# Patient Record
Sex: Male | Born: 1995 | Race: Black or African American | Hispanic: No | Marital: Single | Smoking: Never smoker
Health system: Southern US, Community
[De-identification: ages and names within clinical notes are randomized; demographics above are authoritative.]

## PROBLEM LIST (undated history)

## (undated) HISTORY — PX: SHOULDER SURGERY: SHX246

---

## 2019-06-18 ENCOUNTER — Emergency Department (HOSPITAL_COMMUNITY)
Admission: EM | Admit: 2019-06-18 | Discharge: 2019-06-18 | Disposition: A | Discharge status: Home / Self Care | Attending: Emergency Medicine | Admitting: Emergency Medicine

## 2019-06-18 ENCOUNTER — Encounter (HOSPITAL_COMMUNITY): Payer: Self-pay | Admitting: Emergency Medicine

## 2019-06-18 DIAGNOSIS — R519 Headache, unspecified: Secondary | ICD-10-CM

## 2019-06-18 DIAGNOSIS — R51 Headache: Secondary | ICD-10-CM | POA: Insufficient documentation

## 2019-06-18 MED ORDER — KETOROLAC TROMETHAMINE 10 MG PO TABS
20.0000 mg | ORAL_TABLET | Freq: Once | ORAL | Status: DC
  Filled 2019-06-18: qty 2

## 2019-06-18 MED ORDER — PROCHLORPERAZINE MALEATE 5 MG PO TABS
10.0000 mg | ORAL_TABLET | Freq: Once | ORAL | Status: AC
  Administered 2019-06-18: 10 mg via ORAL
  Filled 2019-06-18: qty 2

## 2019-06-18 MED ORDER — DIPHENHYDRAMINE HCL 25 MG PO CAPS
25.0000 mg | ORAL_CAPSULE | Freq: Once | ORAL | Status: AC
  Administered 2019-06-18: 25 mg via ORAL
  Filled 2019-06-18: qty 1

## 2019-06-18 MED ORDER — KETOROLAC TROMETHAMINE 30 MG/ML IJ SOLN
INTRAMUSCULAR | Status: AC
  Administered 2019-06-18: 30 mg via INTRAMUSCULAR
  Filled 2019-06-18: qty 1

## 2019-06-18 MED ORDER — KETOROLAC TROMETHAMINE 30 MG/ML IJ SOLN
30.0000 mg | Freq: Once | INTRAMUSCULAR | Status: AC
  Administered 2019-06-18: 23:00:00 30 mg via INTRAMUSCULAR

## 2019-06-18 NOTE — ED Notes (Signed)
Patient verbalizes understanding of discharge instructions. Opportunity for questioning and answers were provided. Armband removed by staff, pt discharged from ED ambulatory by self\  

## 2019-06-18 NOTE — ED Triage Notes (Signed)
Pt c/o generalized HA x 2 days, denies n/v, denies photo/phono sensitivity. No relief with Ibuprofen.

## 2019-06-18 NOTE — Discharge Instructions (Signed)
You may take 1000 mg Tylenol at a time for your headache as well as 800 mg of ibuprofen.  Do not take more than 4000 mg Tylenol daily or more than 2400 mg of ibuprofen daily.  If you develop worsening headache, sudden onset thunderclap headache, inability to move her neck, blurred vision, unilateral weakness please seek reevaluation.

## 2019-06-18 NOTE — ED Provider Notes (Signed)
[facility] Provider Note   CSN: [id number] Arrival date & time: 06/18/19  2155    History   Chief Complaint Chief Complaint  Patient presents with  . Headache    HPI Latina CraverJadan D Falco is a 23 y.o. male with past medical history who presents for evaluation of headache.  Patient states he has had a generalized headache x2 days.  Pain located to his entire head.  Pain started approximately 2 days ago when he woke up in the morning.  Denies sudden onset thunderclap headache.  Has worsened today.  Took 2 ibuprofens at 4 AM this morning and then again at 12 PM however is not take anything since.  Denies photophobia or phonophobia.  Does have history of headaches however states they normally go away overnight.  Has had some generalized body aches and pains.  Denies fever, chills, nausea, vomiting, chest pain, shortness of breath, neck stiffness, neck rigidity, chest pain, shortness of breath, abdominal pain, diarrhea, dysuria, facial droop, unilateral weakness, dysphasia, difficulty with word finding, dizziness, lightheadedness.  Denies additional aggravating or alleviating factors.  Rates his pain a 5/10.  Denies radiation.  Described as an aching.  History obtained from patient and past medical records.  No interpreter was used.     HPI  History reviewed. No pertinent past medical history.  There are no active problems to display for this patient.   Past Surgical History:  Procedure Laterality Date  . SHOULDER SURGERY          Home Medications    Prior to Admission medications   Not on File    Family History No family history on file.  Social History Social History   Tobacco Use  . Smoking status: Never Smoker  . Smokeless tobacco: Never Used  Substance Use Topics  . Alcohol use: Never    Frequency: Never  . Drug use: Never     Allergies   Patient has no known allergies.   Review of Systems Review of Systems   Constitutional: Negative.   HENT: Negative.   Respiratory: Negative.   Cardiovascular: Negative.   Genitourinary: Negative.   Musculoskeletal: Negative.   Skin: Negative.   Neurological: Positive for headaches. Negative for dizziness, tremors, seizures, syncope, facial asymmetry, speech difficulty, weakness, light-headedness and numbness.  All other systems reviewed and are negative.  Physical Exam Updated Vital Signs BP 120/75   Pulse 99   Temp 98.4 F (36.9 C) (Oral)   Resp 16   SpO2 97%   Physical Exam  Physical Exam  Constitutional: Pt is oriented to person, place, and time. Pt appears well-developed and well-nourished. No distress.  HENT:  Head: Normocephalic and atraumatic.  Mouth/Throat: Oropharynx is clear and moist.  Eyes: Conjunctivae and EOM are normal. Pupils are equal, round, and reactive to light. No scleral icterus.  No horizontal, vertical or rotational nystagmus  Neck: Normal range of motion. Neck supple.  Full active and passive ROM without pain No midline or paraspinal tenderness No nuchal rigidity or meningeal signs  Cardiovascular: Normal rate, regular rhythm and intact distal pulses.   Pulmonary/Chest: Effort normal and breath sounds normal. No respiratory distress. Pt has no wheezes. No rales.  Abdominal: Soft. Bowel sounds are normal. There is no tenderness. There is no rebound and no guarding.  Musculoskeletal: Normal range of motion.  Lymphadenopathy:   No cervical adenopathy.  Neurological: Pt. is alert and oriented to person, place, and time. He has normal reflexes. No cranial nerve  deficit.  Exhibits normal muscle tone. Coordination normal.  Mental Status:  Alert, oriented, thought content appropriate. Speech fluent without evidence of aphasia. Able to follow 2 step commands without difficulty.  Cranial Nerves:  II:  Peripheral visual fields grossly normal, pupils equal, round, reactive to light III,IV, VI: ptosis not present, extra-ocular  motions intact bilaterally  V,VII: smile symmetric, facial light touch sensation equal VIII: hearing grossly normal bilaterally  IX,X: midline uvula rise  XI: bilateral shoulder shrug equal and strong XII: midline tongue extension  Motor:  5/5 in upper and lower extremities bilaterally including strong and equal grip strength and dorsiflexion/plantar flexion Sensory: Pinprick and light touch normal in all extremities.  Deep Tendon Reflexes: 2+ and symmetric  Cerebellar: normal finger-to-nose with bilateral upper extremities Gait: normal gait and balance CV: distal pulses palpable throughout   Skin: Skin is warm and dry. No rash noted. Pt is not diaphoretic.  Psychiatric: Pt has a normal mood and affect. Behavior is normal. Judgment and thought content normal.  Nursing note and vitals reviewed. ED Treatments / Results  Labs (all labs ordered are listed, but only abnormal results are displayed) Labs Reviewed - No data to display  EKG None  Radiology No results found.  Procedures Procedures (including critical care time)  Medications Ordered in ED Medications  prochlorperazine (COMPAZINE) tablet 10 mg (10 mg Oral Given 06/18/19 2248)  diphenhydrAMINE (BENADRYL) capsule 25 mg (25 mg Oral Given 06/18/19 2248)  ketorolac (TORADOL) 30 MG/ML injection 30 mg (30 mg Intramuscular Given 06/18/19 2248)    Initial Impression / Assessment and Plan / ED Course  I have reviewed the triage vital signs and the nursing notes.  Pertinent labs & imaging results that were available during my care of the patient were reviewed by me and considered in my medical decision making (see chart for details).  23 year old male appears otherwise well presents for evaluation of generalized headache x2 days.  Denies sudden onset thunderclap headache.  He is afebrile, nonseptic, non-ill-appearing.  He has a nonfocal neurologic exam without neurologic deficits.  Has had some generalized body aches and pains  however denies any upper respiratory symptoms or fever.  He is tolerating p.o. intake at home.  No known food exposures.  Has history of headaches for these normally go away when he wakes up.  Has taken ibuprofen twice today.  Has not tried Tylenol.  She does not want IV medications at this time.  Will give p.o. medications.  Nursing has informed me Toradol PO will need to be sent from pharmacy.  Discussed with patient.  He elects IM Toradol and will give p.o. Benadryl and Compazine for headache because " I dont want to wait."  Has had some mild body aches and pains however denies any upper respiratory infection and COVID exposures.  I have low suspicion for COVID infection.  2300: Patient playing video game on phone with all lights no. No acute distress noted. Just given IM Toradol. Will reassess.  2320: Headache completely resolved on reevaluation.  Continues to be neurologically intact.  Discussed return precautions.  Patient voices understanding and is agreeable for follow-up.  Pt HA treated and improved while in ED.  Presentation is like pts typical HA and non concerning for [facility], ICH, Meningitis, or temporal arteritis. Pt is afebrile with no focal neuro deficits, nuchal rigidity, or change in vision. Pt is to follow up with PCP to discuss prophylactic medication. Pt verbalizes understanding and is agreeable with plan to dc.  Final Clinical Impressions(s) / ED Diagnoses   Final diagnoses:  Acute nonintractable headache, unspecified headache type    ED Discharge Orders    None       Kamariyah Timberlake A, PA-C 06/18/19 2324    Maia Plan, MD 06/19/19 [phone]

## 2019-06-24 ENCOUNTER — Emergency Department (HOSPITAL_COMMUNITY)
Admission: EM | Admit: 2019-06-24 | Discharge: 2019-06-24 | Disposition: A | Discharge status: Home / Self Care | Attending: Emergency Medicine | Admitting: Emergency Medicine

## 2019-06-24 ENCOUNTER — Other Ambulatory Visit: Payer: Self-pay

## 2019-06-24 ENCOUNTER — Emergency Department (HOSPITAL_COMMUNITY)

## 2019-06-24 ENCOUNTER — Encounter (HOSPITAL_COMMUNITY): Payer: Self-pay

## 2019-06-24 DIAGNOSIS — N390 Urinary tract infection, site not specified: Secondary | ICD-10-CM | POA: Diagnosis not present

## 2019-06-24 DIAGNOSIS — R319 Hematuria, unspecified: Secondary | ICD-10-CM | POA: Diagnosis present

## 2019-06-24 LAB — URINALYSIS, ROUTINE W REFLEX MICROSCOPIC
Bilirubin Urine: NEGATIVE
Glucose, UA: NEGATIVE mg/dL
Ketones, ur: NEGATIVE mg/dL
Nitrite: NEGATIVE
Protein, ur: 100 mg/dL — AB
RBC / HPF: >50 RBC/hpf — ABNORMAL HIGH (ref 0–5)
Specific Gravity, Urine: 1.024 (ref 1.005–1.030)
WBC, UA: >50 WBC/hpf — ABNORMAL HIGH (ref 0–5)
pH: 5 (ref 5.0–8.0)

## 2019-06-24 LAB — CBC WITH DIFFERENTIAL/PLATELET
Abs Immature Granulocytes: 0.04 10*3/uL (ref 0.00–0.07)
Basophils Absolute: 0.1 10*3/uL (ref 0.0–0.1)
Basophils Relative: 1 %
Eosinophils Absolute: 0 10*3/uL (ref 0.0–0.5)
Eosinophils Relative: 0 %
HCT: 43.2 % (ref 39.0–52.0)
Hemoglobin: 14.4 g/dL (ref 13.0–17.0)
Immature Granulocytes: 0 %
Lymphocytes Relative: 22 %
Lymphs Abs: 2.3 10*3/uL (ref 0.7–4.0)
MCH: 32.1 pg (ref 26.0–34.0)
MCHC: 33.3 g/dL (ref 30.0–36.0)
MCV: 96.4 fL (ref 80.0–100.0)
Monocytes Absolute: 0.8 10*3/uL (ref 0.1–1.0)
Monocytes Relative: 8 %
Neutro Abs: 6.9 10*3/uL (ref 1.7–7.7)
Neutrophils Relative %: 69 %
Platelets: 303 10*3/uL (ref 150–400)
RBC: 4.48 MIL/uL (ref 4.22–5.81)
RDW: 11.5 % (ref 11.5–15.5)
WBC: 10.1 10*3/uL (ref 4.0–10.5)
nRBC: 0 % (ref 0.0–0.2)

## 2019-06-24 LAB — BASIC METABOLIC PANEL
Anion gap: 12 (ref 5–15)
BUN: 10 mg/dL (ref 6–20)
CO2: 23 mmol/L (ref 22–32)
Calcium: 9.2 mg/dL (ref 8.9–10.3)
Chloride: 102 mmol/L (ref 98–111)
Creatinine, Ser: 1.31 mg/dL — ABNORMAL HIGH (ref 0.61–1.24)
GFR calc Af Amer: >60 mL/min (ref >60)
GFR calc non Af Amer: >60 mL/min (ref >60)
Glucose, Bld: 92 mg/dL (ref 70–99)
Potassium: 3.4 mmol/L — ABNORMAL LOW (ref 3.5–5.1)
Sodium: 137 mmol/L (ref 135–145)

## 2019-06-24 MED ORDER — CEPHALEXIN 500 MG PO CAPS: 500.0000 mg | ORAL_CAPSULE | Freq: Two times a day (BID) | ORAL | 0 refills | Status: AC

## 2019-06-24 MED ORDER — SODIUM CHLORIDE 0.9 % IV SOLN
1.0000 g | Freq: Once | INTRAVENOUS | Status: AC
  Administered 2019-06-24: 1 g via INTRAVENOUS
  Filled 2019-06-24: qty 10

## 2019-06-24 MED ORDER — SODIUM CHLORIDE 0.9 % IV BOLUS
500.0000 mL | Freq: Once | INTRAVENOUS | Status: AC
  Administered 2019-06-24: 500 mL via INTRAVENOUS

## 2019-06-24 NOTE — ED Provider Notes (Signed)
[facility] EMERGENCY DEPARTMENT Provider Note   CSN: [id number] Arrival date & time: 06/24/19  1741     History   Chief Complaint Chief Complaint  Patient presents with  . Hematuria    HPI Earl Carter is a 23 y.o. male who is previously healthy who presents with a 1 day history of hematuria.  Patient reports he has been urinating normally and then passed some blood.  After that, it was normal.  He had some trouble pushing the urine out.  About a day ago, he had some right flank pain, which has resolved.  He denies any recent sexual activity, over 3 months ago, and before that, he had negative STD screening.  Patient denies any penile discharge or scrotal pain or swelling.  He also denies any fevers, chest pain, shortness of breath, nausea, vomiting, diarrhea.  Patient denies any history of kidney stones.     HPI  History reviewed. No pertinent past medical history.  There are no active problems to display for this patient.   Past Surgical History:  Procedure Laterality Date  . SHOULDER SURGERY          Home Medications    Prior to Admission medications   Medication Sig Start Date End Date Taking? Authorizing Provider  cephALEXin (KEFLEX) 500 MG capsule Take 1 capsule (500 mg total) by mouth 2 (two) times daily. 06/24/19   Emi Holes, PA-C    Family History History reviewed. No pertinent family history.  Social History Social History   Tobacco Use  . Smoking status: Never Smoker  . Smokeless tobacco: Never Used  Substance Use Topics  . Alcohol use: Never    Frequency: Never  . Drug use: Never     Allergies   Patient has no known allergies.   Review of Systems Review of Systems  Constitutional: Negative for chills and fever.  HENT: Negative for facial swelling and sore throat.   Respiratory: Negative for shortness of breath.   Cardiovascular: Negative for chest pain.  Gastrointestinal: Negative for abdominal pain, nausea and  vomiting.  Genitourinary: Positive for flank pain (resolved) and hematuria. Negative for dysuria.  Musculoskeletal: Negative for back pain.  Skin: Negative for rash and wound.  Neurological: Negative for headaches.  Psychiatric/Behavioral: The patient is not nervous/anxious.      Physical Exam Updated Vital Signs BP 125/73 (BP Location: Right Arm)   Pulse 77   Temp 98.6 F (37 C) (Oral)   Resp 18   Ht 5\' 8"  (1.727 m)   Wt 76.2 kg   SpO2 100%   BMI 25.54 kg/m   Physical Exam Vitals signs and nursing note reviewed.  Constitutional:      General: He is not in acute distress.    Appearance: He is well-developed. He is not diaphoretic.  HENT:     Head: Normocephalic and atraumatic.     Mouth/Throat:     Pharynx: No oropharyngeal exudate.  Eyes:     General: No scleral icterus.       Right eye: No discharge.        Left eye: No discharge.     Conjunctiva/sclera: Conjunctivae normal.     Pupils: Pupils are equal, round, and reactive to light.  Neck:     Musculoskeletal: Normal range of motion and neck supple.     Thyroid: No thyromegaly.  Cardiovascular:     Rate and Rhythm: Normal rate and regular rhythm.     Heart sounds: Normal  heart sounds. No murmur. No friction rub. No gallop.   Pulmonary:     Effort: Pulmonary effort is normal. No respiratory distress.     Breath sounds: Normal breath sounds. No stridor. No wheezing or rales.  Abdominal:     General: Bowel sounds are normal. There is no distension.     Palpations: Abdomen is soft.     Tenderness: There is no abdominal tenderness. There is no guarding or rebound.  Lymphadenopathy:     Cervical: No cervical adenopathy.  Skin:    General: Skin is warm and dry.     Coloration: Skin is not pale.     Findings: No rash.  Neurological:     Mental Status: He is alert.     Coordination: Coordination normal.      ED Treatments / Results  Labs (all labs ordered are listed, but only abnormal results are displayed)  Labs Reviewed  URINALYSIS, ROUTINE W REFLEX MICROSCOPIC - Abnormal; Notable for the following components:      Result Value   APPearance CLOUDY (*)    Hgb urine dipstick LARGE (*)    Protein, ur 100 (*)    Leukocytes,Ua LARGE (*)    RBC / HPF >50 (*)    WBC, UA >50 (*)    Bacteria, UA RARE (*)    All other components within normal limits  BASIC METABOLIC PANEL - Abnormal; Notable for the following components:   Potassium 3.4 (*)    Creatinine, Ser 1.31 (*)    All other components within normal limits  URINE CULTURE  CBC WITH DIFFERENTIAL/PLATELET  GC/CHLAMYDIA PROBE AMP ([facility] NOT [facility]    EKG None  Radiology Ct Renal Stone Study  Result Date: 06/24/2019 CLINICAL DATA:  Right-sided flank pain with stone disease suspected. EXAM: CT ABDOMEN AND PELVIS WITHOUT CONTRAST TECHNIQUE: Multidetector CT imaging of the abdomen and pelvis was performed following the standard protocol without IV contrast. COMPARISON:  None. FINDINGS: Lower chest: The lung bases are clear. The heart size is normal. Hepatobiliary: The liver is normal. Normal gallbladder.There is no biliary ductal dilation. Pancreas: Normal contours without ductal dilatation. No peripancreatic fluid collection. Spleen: No splenic laceration or hematoma. Adrenals/Urinary Tract: --Adrenal glands: No adrenal hemorrhage. --Right kidney/ureter: No hydronephrosis or perinephric hematoma. --Left kidney/ureter: No hydronephrosis or perinephric hematoma. --Urinary bladder: There is some mild bladder wall thickening. The bladder is somewhat underdistended. Stomach/Bowel: --Stomach/Duodenum: No hiatal hernia or other gastric abnormality. Normal duodenal course and caliber. --Small bowel: No dilatation or inflammation. --Colon: No focal abnormality. --Appendix: Appendix is somewhat difficult to identified appears to be located in the right lower quadrant best visualized on the coronal series image 31. The appendix measures approximately 4 mm.  Vascular/Lymphatic: Normal course and caliber of the major abdominal vessels. --No retroperitoneal lymphadenopathy. --No mesenteric lymphadenopathy. --No pelvic or inguinal lymphadenopathy. Reproductive: Unremarkable multiple phleboliths are noted in the patient's pelvis. Other: No ascites or free air. The abdominal wall is normal. Musculoskeletal. There is an old fracture deformity of the left iliac bone. There is no acute displaced fracture or dislocation. IMPRESSION: 1. Examination limited by lack of IV contrast. 2. No acute abdominopelvic abnormality. 3. There is some mild bladder wall thickening. Correlate with urinalysis for possible cystitis. 4. No hydronephrosis.  No radiopaque kidney stones. Electronically Signed   By: Katherine Mantlehristopher  Green M.D.   On: 06/24/2019 20:40    Procedures Procedures (including critical care time)  Medications Ordered in ED Medications  sodium chloride 0.9 % bolus  500 mL (0 mLs Intravenous Stopped 06/24/19 2102)  cefTRIAXone (ROCEPHIN) 1 g in sodium chloride 0.9 % 100 mL IVPB (0 g Intravenous Stopped 06/24/19 2102)     Initial Impression / Assessment and Plan / ED Course  I have reviewed the triage vital signs and the nursing notes.  Pertinent labs & imaging results that were available during my care of the patient were reviewed by me and considered in my medical decision making (see chart for details).        Patient found to have UTI with mild elevation in creatinine, 1.31.  IV fluids and IV Rocephin given.  CT renal stone study is negative except for some mild bladder thickening consistent with cystitis.  GC/chlamydia sent and pending, however patient adamantly denies no recent sexual contact and negative STD checks before that.  Urine culture sent.  Will treat with Keflex.  Follow-up to urology if symptoms not improving.  Patient understands and agrees with plan.  Patient vital stable throughout ED course and discharged in satisfactory condition. I discussed  patient case with Dr. Francia Greaves who guided the patient's management and agrees with plan.   Final Clinical Impressions(s) / ED Diagnoses   Final diagnoses:  Lower urinary tract infectious disease    ED Discharge Orders         Ordered    cephALEXin (KEFLEX) 500 MG capsule  2 times daily     06/24/19 2221           Frederica Kuster, PA-C 06/25/19 1609    Valarie Merino, MD 06/30/19 2245

## 2019-06-24 NOTE — ED Triage Notes (Signed)
Pt state that he had a difficult time urinating at home & that after he finally did he had some blood come out of his penis. Pt stated that yesterday he had some Rt sided flank pain but has been afebrile so far.

## 2019-06-24 NOTE — Discharge Instructions (Addendum)
Take Keflex until completed.  You will be called if you test positive for gonorrhea or chlamydia or if you need to change your antibiotic after the urine culture.  Please follow-up with urologist, especially if you have continuing symptoms.  Please return to emergency department if you develop any new or worsening symptoms, including fever of 100.4, severe abdominal or flank pain, vomiting, or any other concerning symptoms.

## 2019-06-26 LAB — URINE CULTURE: Culture: NO GROWTH

## 2019-06-29 ENCOUNTER — Telehealth (HOSPITAL_COMMUNITY): Payer: Self-pay

## 2019-06-29 LAB — GC/CHLAMYDIA PROBE AMP (~~LOC~~) NOT AT ARMC
Chlamydia: NEGATIVE
Neisseria Gonorrhea: NEGATIVE

## 2020-07-16 ENCOUNTER — Encounter (HOSPITAL_COMMUNITY): Payer: Self-pay

## 2020-07-16 ENCOUNTER — Other Ambulatory Visit: Payer: Self-pay

## 2020-07-16 ENCOUNTER — Emergency Department (HOSPITAL_COMMUNITY)
Admission: EM | Admit: 2020-07-16 | Discharge: 2020-07-16 | Disposition: A | Discharge status: Home / Self Care | Attending: Emergency Medicine | Admitting: Emergency Medicine

## 2020-07-16 DIAGNOSIS — J029 Acute pharyngitis, unspecified: Secondary | ICD-10-CM | POA: Insufficient documentation

## 2020-07-16 LAB — GROUP A STREP BY PCR: Group A Strep by PCR: NOT DETECTED

## 2020-07-16 NOTE — ED Triage Notes (Signed)
Pt arrived via walk in, c/o sore throat x3 days. States there were some white patches he saw as well.

## 2020-07-16 NOTE — ED Notes (Signed)
Pt verbalized dc instructions and follow up care. Alert and ambulatory. No iv. 

## 2020-07-16 NOTE — ED Provider Notes (Signed)
[facility]-EMERGENCY DEPT Provider Note   CSN: [id number] Arrival date & time: 07/16/20  1551     History Chief Complaint  Patient presents with  . Sore Throat    Earl Carter is a 24 y.o. male    The history is provided by the patient.  Sore Throat This is a new problem. The current episode started 2 days ago. The problem occurs constantly. The problem has not changed since onset.Pertinent negatives include no chest pain, no abdominal pain, no headaches and no shortness of breath. The symptoms are aggravated by swallowing. The symptoms are relieved by acetaminophen. He has tried nothing for the symptoms. The treatment provided significant relief.       History reviewed. No pertinent past medical history.  There are no problems to display for this patient.   Past Surgical History:  Procedure Laterality Date  . SHOULDER SURGERY         History reviewed. No pertinent family history.  Social History   Tobacco Use  . Smoking status: Never Smoker  . Smokeless tobacco: Never Used  Substance Use Topics  . Alcohol use: Never  . Drug use: Never    Home Medications Prior to Admission medications   Medication Sig Start Date End Date Taking? Authorizing Provider  cephALEXin (KEFLEX) 500 MG capsule Take 1 capsule (500 mg total) by mouth 2 (two) times daily. 06/24/19   Emi Holes, PA-C    Allergies    Patient has no known allergies.  Review of Systems   Review of Systems  Constitutional: Negative for chills and fever.  HENT: Positive for sore throat. Negative for voice change.   Respiratory: Negative for cough and shortness of breath.   Cardiovascular: Negative for chest pain.  Gastrointestinal: Negative for abdominal pain.  Musculoskeletal: Negative for neck stiffness.  Skin: Negative for rash.  Neurological: Negative for weakness and headaches.    Physical Exam Updated Vital Signs BP 133/74 (BP Location: Left Arm)   Pulse 72    Temp 98.5 F (36.9 C) (Oral)   Resp 16   Ht 5' 7.5" (1.715 m)   Wt 77.1 kg   SpO2 99%   BMI 26.23 kg/m   Physical Exam Vitals and nursing note reviewed.  Constitutional:      General: He is not in acute distress.    Appearance: He is well-developed. He is not diaphoretic.  HENT:     Head: Normocephalic and atraumatic.     Nose: No congestion.     Mouth/Throat:     Mouth: Mucous membranes are moist.     Pharynx: Uvula midline. No pharyngeal swelling, oropharyngeal exudate, posterior oropharyngeal erythema or uvula swelling.     Tonsils: Tonsillar exudate present. No tonsillar abscesses. 1+ on the right. 1+ on the left.  Eyes:     General: No scleral icterus.    Conjunctiva/sclera: Conjunctivae normal.  Cardiovascular:     Rate and Rhythm: Normal rate and regular rhythm.     Heart sounds: Normal heart sounds.  Pulmonary:     Effort: Pulmonary effort is normal. No respiratory distress.     Breath sounds: Normal breath sounds.  Abdominal:     Palpations: Abdomen is soft.     Tenderness: There is no abdominal tenderness.  Musculoskeletal:     Cervical back: Normal range of motion and neck supple.  Lymphadenopathy:     Head:     Right side of head: Tonsillar adenopathy present.     Left  side of head: Tonsillar adenopathy present.  Skin:    General: Skin is warm and dry.  Neurological:     Mental Status: He is alert.  Psychiatric:        Behavior: Behavior normal.     ED Results / Procedures / Treatments   Labs (all labs ordered are listed, but only abnormal results are displayed) Labs Reviewed  GROUP A STREP BY PCR    EKG None  Radiology No results found.  Procedures Procedures (including critical care time)  Medications Ordered in ED Medications - No data to display  ED Course  I have reviewed the triage vital signs and the nursing notes.  Pertinent labs & imaging results that were available during my care of the patient were reviewed by me and  considered in my medical decision making (see chart for details).    MDM Rules/Calculators/A&P                          Pt afebrile without tonsillar exudate, negative strep. Presents with mild cervical lymphadenopathy, & dysphagia; diagnosis of viral pharyngitis. No abx indicated. DC w symptomatic tx for pain  Pt does not appear dehydrated, but did discuss importance of water rehydration. Presentation non concerning for PTA or infxn spread to soft tissue. No trismus or uvula deviation. Specific return precautions discussed. Pt able to drink water in ED without difficulty with intact air way. Recommended PCP follow up.  Final Clinical Impression(s) / ED Diagnoses Final diagnoses:  Viral pharyngitis    Rx / DC Orders ED Discharge Orders    None       Arthor Captain, PA-C 07/16/20 1854    Terrilee Files, MD 07/17/20 1009

## 2020-07-16 NOTE — Discharge Instructions (Addendum)
Contact a health care provider if: You have large, tender lumps in your neck. You have a rash. You cough up green, yellow-brown, or bloody spit. Get help right away if: Your neck becomes stiff. You drool or are unable to swallow liquids. You cannot drink or take medicines without vomiting. You have severe pain that does not go away, even after you take medicine. You have trouble breathing, and it is not caused by a stuffy nose. You have new pain and swelling in your joints such as the knees, ankles, wrists, or elbows.

## 2020-12-01 ENCOUNTER — Encounter (HOSPITAL_COMMUNITY): Payer: Self-pay | Admitting: Emergency Medicine

## 2020-12-01 ENCOUNTER — Emergency Department (HOSPITAL_COMMUNITY)
Admission: EM | Admit: 2020-12-01 | Discharge: 2020-12-01 | Disposition: A | Discharge status: Home / Self Care | Attending: Emergency Medicine | Admitting: Emergency Medicine

## 2020-12-01 ENCOUNTER — Other Ambulatory Visit: Payer: Self-pay

## 2020-12-01 DIAGNOSIS — K1379 Other lesions of oral mucosa: Secondary | ICD-10-CM | POA: Diagnosis present

## 2020-12-01 DIAGNOSIS — Z5321 Procedure and treatment not carried out due to patient leaving prior to being seen by health care provider: Secondary | ICD-10-CM | POA: Diagnosis not present

## 2020-12-01 NOTE — ED Triage Notes (Addendum)
Pt states his tongue was resting in an awkward position and has caused his palate to be deformed.  States he is holding his tongue differently now and just wants his palate checked.

## 2020-12-01 NOTE — ED Notes (Signed)
Pt stated he was leaving. He said he wasn't from here and that's why he came to the ED. He said he would make an appt with his PCP when get back home.

## 2020-12-03 ENCOUNTER — Ambulatory Visit: Attending: Family

## 2021-01-04 ENCOUNTER — Ambulatory Visit: Attending: Family

## 2021-01-04 DIAGNOSIS — Z23 Encounter for immunization: Secondary | ICD-10-CM

## 2021-03-14 NOTE — Progress Notes (Signed)
   Covid-19 Vaccination Clinic  Name:  RENO CLASBY    MRN: [id number] DOB: September 21, 1996  03/14/2021  Mr. Cieslinski was observed post Covid-19 immunization for 15 minutes without incident. He was provided with Vaccine Information Sheet and instruction to access the V-Safe system.   Mr. Nott was instructed to call 911 with any severe reactions post vaccine: Marland Kitchen Difficulty breathing  . Swelling of face and throat  . A fast heartbeat  . A bad rash all over body  . Dizziness and weakness   Immunizations Administered    Name Date Dose VIS Date Route   Moderna COVID-19 Vaccine 01/04/2021  6:50 AM 0.5 mL 08/08/2020 Intramuscular   Manufacturer: Moderna   Lot: [id number]   NDC: 62831-517-61

## 2021-03-20 ENCOUNTER — Other Ambulatory Visit: Payer: Self-pay

## 2021-03-20 ENCOUNTER — Emergency Department (HOSPITAL_COMMUNITY)
Admission: EM | Admit: 2021-03-20 | Discharge: 2021-03-20 | Disposition: A | Discharge status: Home / Self Care | Attending: Emergency Medicine | Admitting: Emergency Medicine

## 2021-03-20 DIAGNOSIS — K120 Recurrent oral aphthae: Secondary | ICD-10-CM | POA: Insufficient documentation

## 2021-03-20 MED ORDER — ACYCLOVIR 5 % EX OINT: 1.0000 "application " | TOPICAL_OINTMENT | CUTANEOUS | 0 refills | Status: AC

## 2021-03-20 NOTE — ED Triage Notes (Signed)
Pt reports pimple to lower lip x 1 week.

## 2021-03-20 NOTE — Discharge Instructions (Signed)
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  If you start noticing blister formation, burning pain or increase redness, then apply zovirax ointment three times daily x 1 week as it could be oral herpes.

## 2021-03-20 NOTE — ED Provider Notes (Signed)
[facility] EMERGENCY DEPARTMENT Provider Note   CSN: [id number] Arrival date & time: 03/20/21  1337     History Chief Complaint  Patient presents with  . Acne    MARBIN OLSHEFSKI is a 25 y.o. male.  The history is provided by the patient. No language interpreter was used.     25 year old male present with concern of skin changes.  Pt report he noticed a lesion to the corner of his mouth on going x 1 week.  It is mildly uncomfortable but no burning sensation, no itchiness and no significant pain.  No new sexual partner.  No fever, sores inside his mouth and nor prior hx of oral herpes.  No speecific treatment tried.    No past medical history on file.  There are no problems to display for this patient.   Past Surgical History:  Procedure Laterality Date  . SHOULDER SURGERY         No family history on file.  Social History   Tobacco Use  . Smoking status: Never Smoker  . Smokeless tobacco: Never Used  Substance Use Topics  . Alcohol use: Never  . Drug use: Never    Home Medications Prior to Admission medications   Medication Sig Start Date End Date Taking? Authorizing Provider  cephALEXin (KEFLEX) 500 MG capsule Take 1 capsule (500 mg total) by mouth 2 (two) times daily. 06/24/19   Emi Holes, PA-C    Allergies    Patient has no known allergies.  Review of Systems   Review of Systems  Constitutional: Negative for fever.  HENT: Negative for dental problem and mouth sores.   Neurological: Negative for headaches.    Physical Exam Updated Vital Signs BP 131/70 (BP Location: Right Arm)   Pulse 70   Temp 98.7 F (37.1 C)   Resp 16   SpO2 100%   Physical Exam Vitals and nursing note reviewed.  Constitutional:      General: He is not in acute distress.    Appearance: He is well-developed.  HENT:     Head: Atraumatic.     Mouth/Throat:     Comments: Right corner of mouth is a 39mm papule without obvious blister or obvious  ulceration noted.  It is nonerythematous. No mucosal involvement.  Eyes:     Conjunctiva/sclera: Conjunctivae normal.  Musculoskeletal:     Cervical back: Neck supple.  Skin:    Findings: No rash.  Neurological:     Mental Status: He is alert.     ED Results / Procedures / Treatments   Labs (all labs ordered are listed, but only abnormal results are displayed) Labs Reviewed - No data to display  EKG None  Radiology No results found.  Procedures Procedures   Medications Ordered in ED Medications - No data to display  ED Course  I have reviewed the triage vital signs and the nursing notes.  Pertinent labs & imaging results that were available during my care of the patient were reviewed by me and considered in my medical decision making (see chart for details).    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  Will prescribe zovirax if it persists. Oral care recommended.    Fayrene Helper, PA-C 03/20/21 1404    Milagros Loll, MD 03/21/21 [phone]

## 2021-03-22 ENCOUNTER — Encounter (HOSPITAL_COMMUNITY): Payer: Self-pay | Admitting: Emergency Medicine

## 2021-03-22 ENCOUNTER — Ambulatory Visit (HOSPITAL_COMMUNITY)
Admission: EM | Admit: 2021-03-22 | Discharge: 2021-03-22 | Disposition: A | Discharge status: Home / Self Care | Payer: Self-pay | Attending: Physician Assistant | Admitting: Physician Assistant

## 2021-03-22 ENCOUNTER — Other Ambulatory Visit: Payer: Self-pay

## 2021-03-22 DIAGNOSIS — L089 Local infection of the skin and subcutaneous tissue, unspecified: Secondary | ICD-10-CM

## 2021-03-22 DIAGNOSIS — L739 Follicular disorder, unspecified: Secondary | ICD-10-CM

## 2021-03-22 MED ORDER — MUPIROCIN 2 % EX OINT: 1.0000 "application " | TOPICAL_OINTMENT | Freq: Every day | CUTANEOUS | 0 refills | Status: AC

## 2021-03-22 NOTE — ED Provider Notes (Signed)
[facility]    CSN: [id number] Arrival date & time: 03/22/21  1609      History   Chief Complaint Chief Complaint  Patient presents with  . Abscess    Below lower lip    HPI Earl Carter is a 25 y.o. male.   Patient presents today with a several day history of skin lesion inferior to right lip.  Patient denies any injury in this area.  He denies any associated pain or prodromal symptoms including numbness or tingling.  He was seen at the emergency room for similar symptoms which point symptoms were thought to be attributed to herpes simplex and he was prescribed topical antivirals.  He denies ever having ulcerated or vesicular lesion and so did not start this medication.  Earlier today lesion was larger and he was able to express some purulent drainage.  He denies history of skin infections or MRSA.  He has tried several other over-the-counter medications including topical antibiotic ointment with improvement but not resolution of symptoms.  He does shave this area and wonders if this could be contributing to symptoms.  He denies any recent antibiotic use.  He denies any oral lesions.     History reviewed. No pertinent past medical history.  There are no problems to display for this patient.   Past Surgical History:  Procedure Laterality Date  . SHOULDER SURGERY         Home Medications    Prior to Admission medications   Medication Sig Start Date End Date Taking? Authorizing Provider  mupirocin ointment (BACTROBAN) 2 % Apply 1 application topically daily. 03/22/21  Yes Charolett Yarrow, Denny Peon K, PA-C  acyclovir ointment (ZOVIRAX) 5 % Apply 1 application topically every 3 (three) hours for 7 days. 03/20/21 03/27/21  Fayrene Helper, PA-C  cephALEXin (KEFLEX) 500 MG capsule Take 1 capsule (500 mg total) by mouth 2 (two) times daily. 06/24/19   Emi Holes, PA-C    Family History History reviewed. No pertinent family history.  Social History Social History   Tobacco Use   . Smoking status: Never Smoker  . Smokeless tobacco: Never Used  Substance Use Topics  . Alcohol use: Never  . Drug use: Never     Allergies   Patient has no known allergies.   Review of Systems Review of Systems  Constitutional: Negative for activity change, appetite change, fatigue and fever.  Respiratory: Negative for cough and shortness of breath.   Cardiovascular: Negative for chest pain.  Gastrointestinal: Negative for abdominal pain, diarrhea, nausea and vomiting.  Skin: Positive for wound. Negative for color change.  Neurological: Negative for dizziness, light-headedness and headaches.     Physical Exam Triage Vital Signs ED Triage Vitals  Enc Vitals Group     BP 03/22/21 1652 124/68     Pulse Rate 03/22/21 1652 71     Resp 03/22/21 1652 16     Temp 03/22/21 1652 99 F (37.2 C)     Temp Source 03/22/21 1652 Oral     SpO2 03/22/21 1652 99 %     Weight --      Height --      Head Circumference --      Peak Flow --      Pain Score 03/22/21 1651 0     Pain Loc --      Pain Edu? --      Excl. in GC? --    No data found.  Updated Vital Signs BP 124/68  Pulse 71   Temp 99 F (37.2 C) (Oral)   Resp 16   SpO2 99%   Visual Acuity Right Eye Distance:   Left Eye Distance:   Bilateral Distance:    Right Eye Near:   Left Eye Near:    Bilateral Near:     Physical Exam Vitals reviewed.  Constitutional:      General: He is awake.     Appearance: Normal appearance. He is normal weight. He is not ill-appearing.     Comments: Very pleasant male appears stated age in no acute distress  HENT:     Head: Normocephalic and atraumatic.     Mouth/Throat:     Mouth: Mucous membranes are moist. No oral lesions.     Pharynx: Uvula midline. No oropharyngeal exudate, posterior oropharyngeal erythema or uvula swelling.  Cardiovascular:     Rate and Rhythm: Normal rate and regular rhythm.     Heart sounds: Normal heart sounds. No murmur heard.   Pulmonary:      Effort: Pulmonary effort is normal.     Breath sounds: Normal breath sounds. No stridor. No wheezing, rhonchi or rales.     Comments: Clear to auscultation bilaterally Skin:    Comments: Small pustule noted inferior to vermilion border on right lower lip.  No active bleeding or drainage.  No streaking or evidence of lymphangitis.  No additional lesions noted.  Neurological:     Mental Status: He is alert.  Psychiatric:        Behavior: Behavior is cooperative.      UC Treatments / Results  Labs (all labs ordered are listed, but only abnormal results are displayed) Labs Reviewed - No data to display  EKG   Radiology No results found.  Procedures Procedures (including critical care time)  Medications Ordered in UC Medications - No data to display  Initial Impression / Assessment and Plan / UC Course  I have reviewed the triage vital signs and the nursing notes.  Pertinent labs & imaging results that were available during my care of the patient were reviewed by me and considered in my medical decision making (see chart for details).     Small pustule noted on physical exam consistent with folliculitis.  This area is too small for I&D in clinic and has already been spontaneously draining.  Patient was encouraged to use warm compresses and keep area clean.  He is to apply Bactroban ointment 1-2 times daily.  Discussed that if this enlarges or develops any other symptoms he is to return to consider systemic antibiotics.  He was encouraged to follow-up with dermatology if symptoms do not resolve.  Strict return precautions given to which patient expressed understanding.  Final Clinical Impressions(s) / UC Diagnoses   Final diagnoses:  Folliculitis  Pustule     Discharge Instructions     This appears to be a little area of infection.  It is too small to drain.  Please apply ointment 1-2 times daily.  You can use warm compresses on this area.  If it grows in size or develop  additional symptoms you need to be reevaluated as we discussed.    ED Prescriptions    Medication Sig Dispense Auth. Provider   mupirocin ointment (BACTROBAN) 2 % Apply 1 application topically daily. 22 g Juniper Snyders K, PA-C     PDMP not reviewed this encounter.   Jeani Hawking, PA-C 03/22/21 1714

## 2021-03-22 NOTE — ED Triage Notes (Signed)
Pt reports red swollen area below lower lip

## 2021-03-22 NOTE — Discharge Instructions (Signed)
This appears to be a little area of infection.  It is too small to drain.  Please apply ointment 1-2 times daily.  You can use warm compresses on this area.  If it grows in size or develop additional symptoms you need to be reevaluated as we discussed.

## 2021-03-27 IMAGING — CT CT RENAL STONE PROTOCOL
2 of 4 series · 16 of 46 positions shown, 18 images · non-contrast
Comparison: None.

CLINICAL DATA: Right-sided flank pain with stone disease suspected.

EXAM:
CT ABDOMEN AND PELVIS WITHOUT CONTRAST
TECHNIQUE: Multidetector CT imaging of the abdomen and pelvis was performed
following the standard protocol without IV contrast.

[Series 3: stone study 5.0 i30f 2 · axial · 0.64mm/px · z∈[+672,+1107]mm · 13 of 95 slices shown, 15 images]
[im 4/95  soft-tissue]
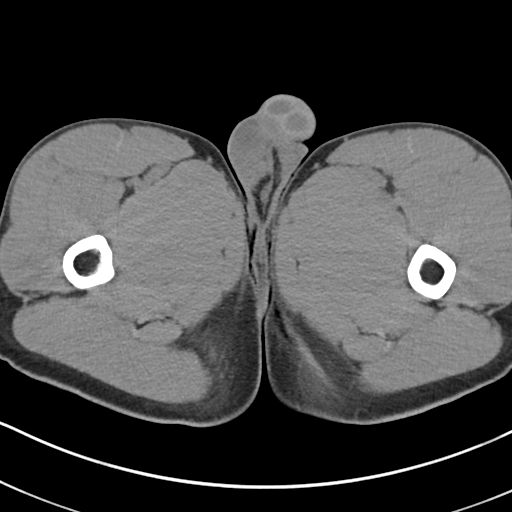
[im 4/95  bone]
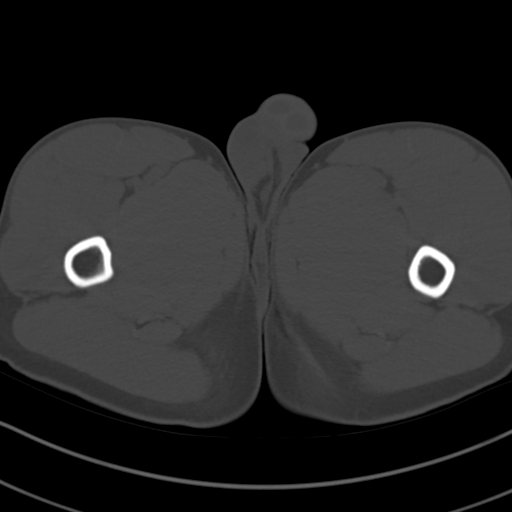
[im 12/95  soft-tissue]
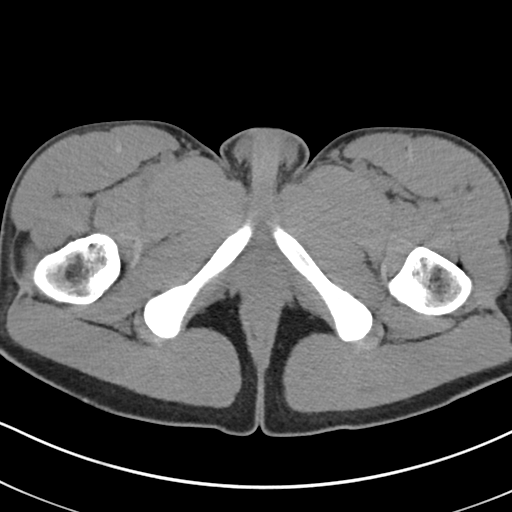
[im 20/95  soft-tissue]
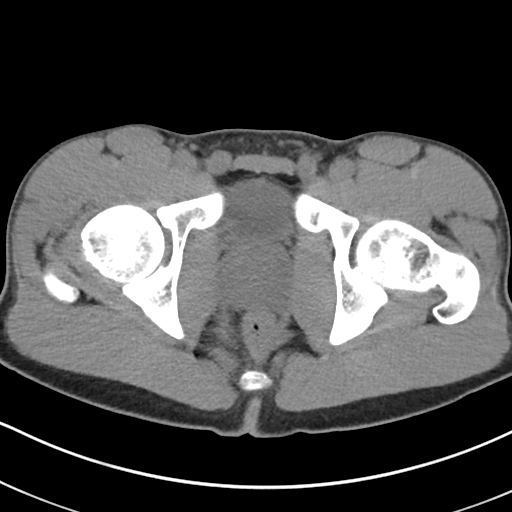
[im 28/95  soft-tissue]
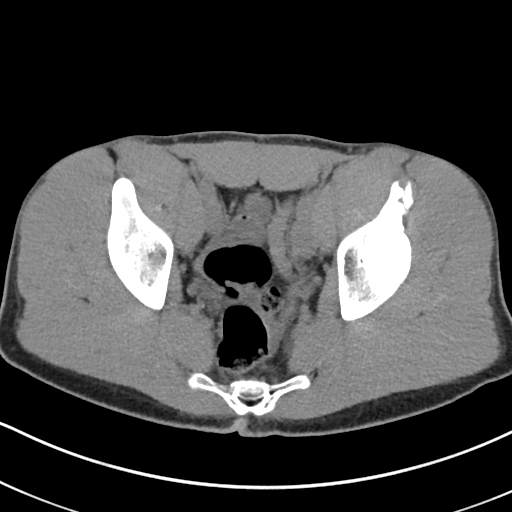
[im 32/95  soft-tissue]
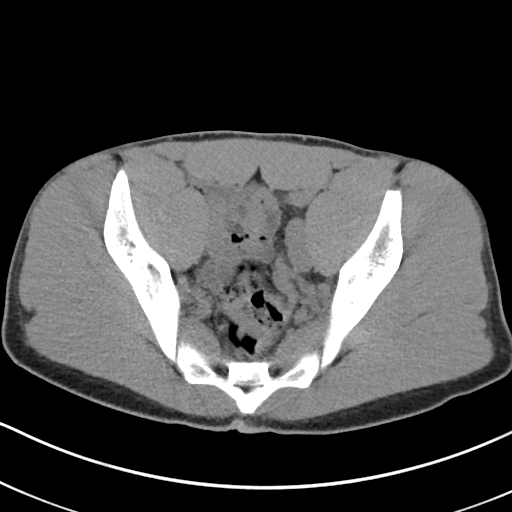
[im 40/95  soft-tissue]
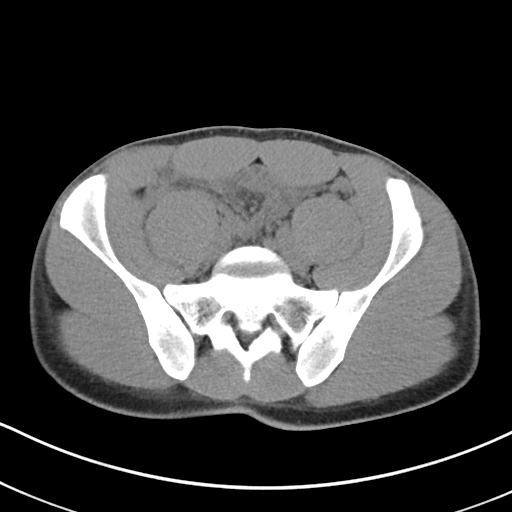
[im 48/95  soft-tissue]
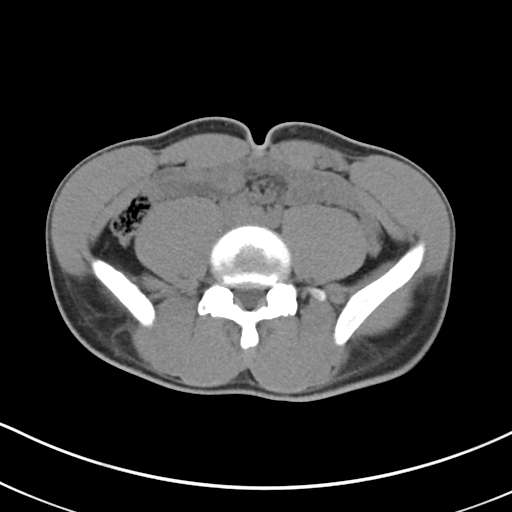
[im 55/95  soft-tissue]
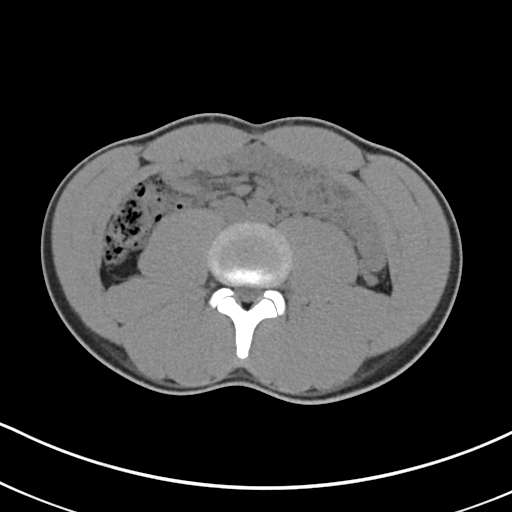
[im 63/95  soft-tissue]
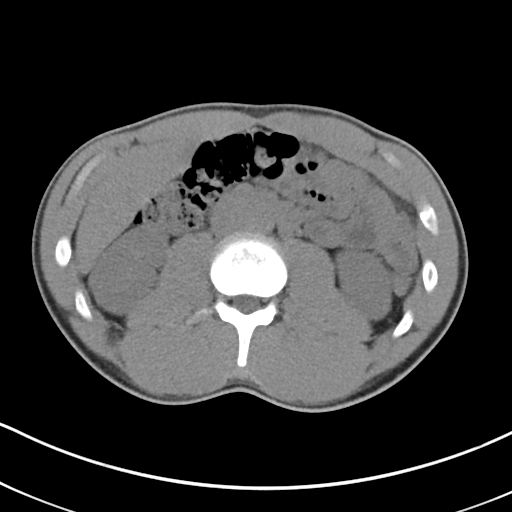
[im 63/95  bone]
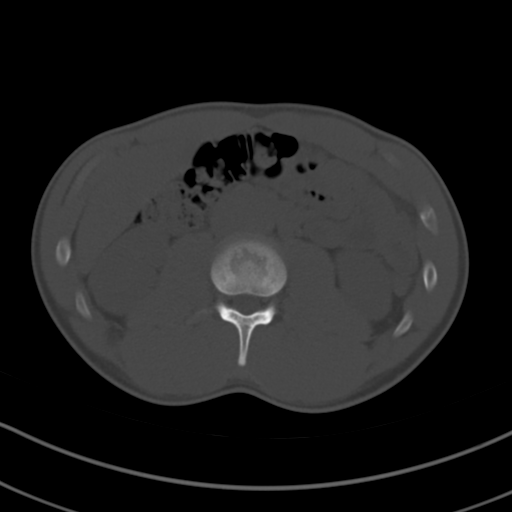
[im 67/95  soft-tissue]
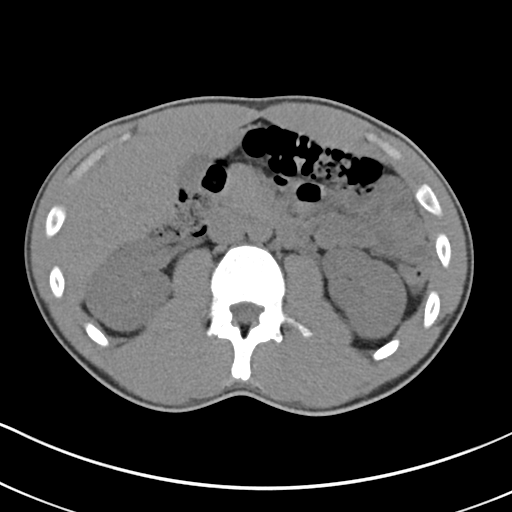
[im 75/95  soft-tissue]
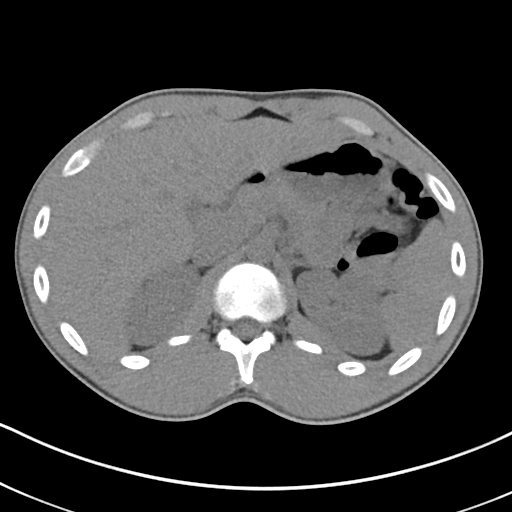
[im 83/95  soft-tissue]
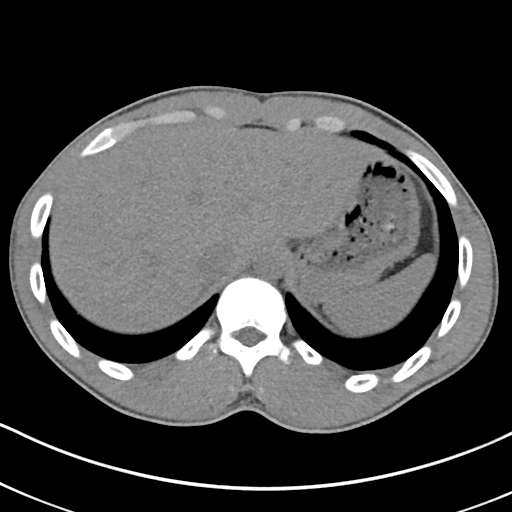
[im 91/95  soft-tissue]
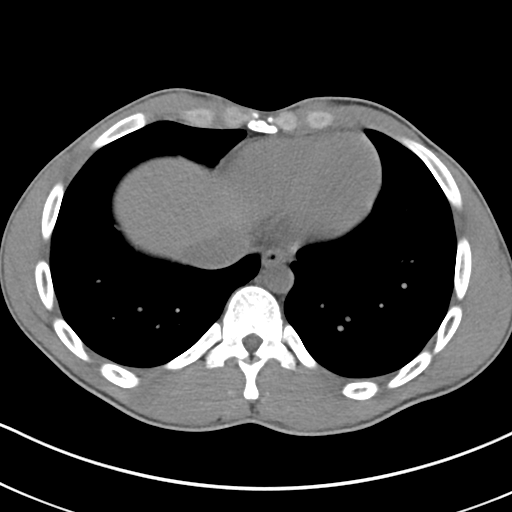

[Series 6: coronal soft tissue · coronal · 0.69mm/px · 3 of 84 slices shown]
[im 28/84  soft-tissue]
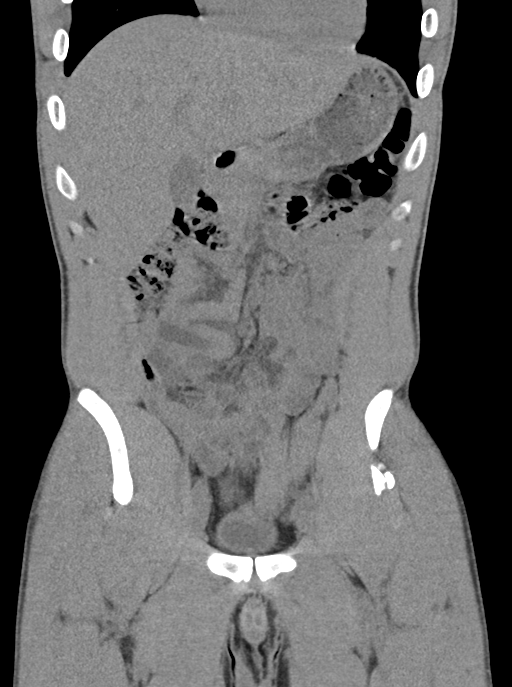
[im 37/84  soft-tissue]
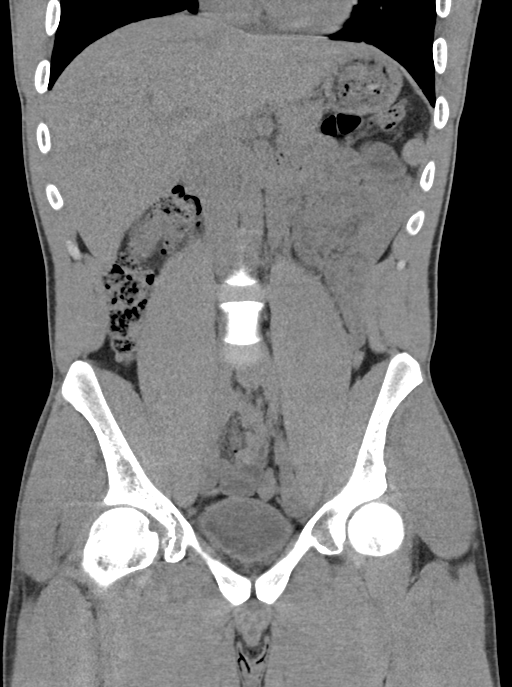
[im 47/84  soft-tissue]
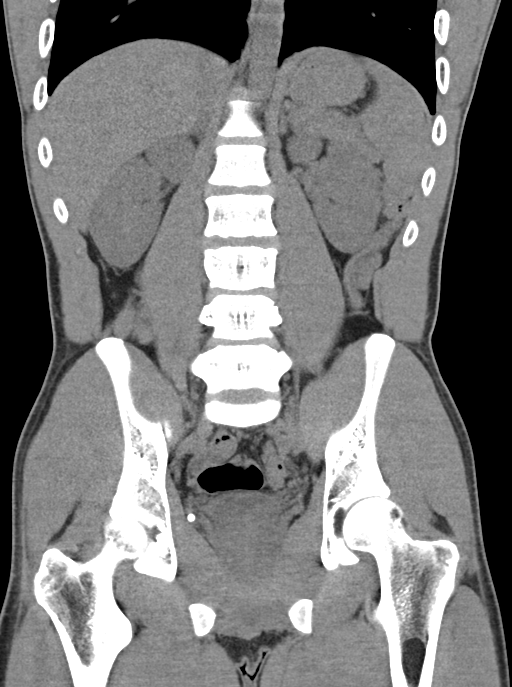

[16 of 46 positions shown; findings below may reference images not displayed]

FINDINGS: Lower chest: The lung bases are clear. The heart size is normal.

Hepatobiliary: The liver is normal. Normal gallbladder.There is no
biliary ductal dilation.

Pancreas: Normal contours without ductal dilatation. No
peripancreatic fluid collection.

Spleen: No splenic laceration or hematoma.

Adrenals/Urinary Tract:

--Adrenal glands: No adrenal hemorrhage.

--Right kidney/ureter: No hydronephrosis or perinephric hematoma.

--Left kidney/ureter: No hydronephrosis or perinephric hematoma.

--Urinary bladder: There is some mild bladder wall thickening. The
bladder is somewhat underdistended.

Stomach/Bowel:

--Stomach/Duodenum: No hiatal hernia or other gastric abnormality.
Normal duodenal course and caliber.

--Small bowel: No dilatation or inflammation.

--Colon: No focal abnormality.

--Appendix: Appendix is somewhat difficult to identified appears to
be located in the right lower quadrant best visualized on the
coronal series image 31. The appendix measures approximately 4 mm.

Vascular/Lymphatic: Normal course and caliber of the major abdominal
vessels.

--No retroperitoneal lymphadenopathy.

--No mesenteric lymphadenopathy.

--No pelvic or inguinal lymphadenopathy.

Reproductive: Unremarkable multiple phleboliths are noted in the
patient's pelvis.

Other: No ascites or free air. The abdominal wall is normal.

Musculoskeletal. There is an old fracture deformity of the left
iliac bone. There is no acute displaced fracture or dislocation.
IMPRESSION: 1. Examination limited by lack of IV contrast.
2. No acute abdominopelvic abnormality.
3. There is some mild bladder wall thickening. Correlate with
urinalysis for possible cystitis.
4. No hydronephrosis.  No radiopaque kidney stones.
